# Patient Record
Sex: Female | Born: 1935 | Race: White | Hispanic: No | Marital: Married | Smoking: Never smoker
Health system: Southern US, Community
[De-identification: ages and names within clinical notes are randomized; demographics above are authoritative.]

## PROBLEM LIST (undated history)

## (undated) DIAGNOSIS — E785 Hyperlipidemia, unspecified: Secondary | ICD-10-CM

## (undated) DIAGNOSIS — I10 Essential (primary) hypertension: Secondary | ICD-10-CM

## (undated) HISTORY — PX: ABDOMINAL HYSTERECTOMY: SHX81

## (undated) HISTORY — PX: TUBAL LIGATION: SHX77

## (undated) HISTORY — PX: CHOLECYSTECTOMY: SHX55

---

## 1998-07-30 ENCOUNTER — Other Ambulatory Visit: Admission: RE | Admit: 1998-07-30 | Discharge: 1998-07-30 | Payer: Self-pay | Admitting: *Deleted

## 2000-09-14 ENCOUNTER — Other Ambulatory Visit: Admission: RE | Admit: 2000-09-14 | Discharge: 2000-09-14 | Payer: Self-pay | Admitting: *Deleted

## 2001-01-11 ENCOUNTER — Encounter: Admission: RE | Admit: 2001-01-11 | Discharge: 2001-01-11 | Payer: Self-pay | Admitting: Geriatric Medicine

## 2001-01-11 ENCOUNTER — Encounter: Payer: Self-pay | Admitting: Geriatric Medicine

## 2001-12-11 ENCOUNTER — Encounter: Payer: Self-pay | Admitting: Geriatric Medicine

## 2001-12-11 ENCOUNTER — Encounter: Admission: RE | Admit: 2001-12-11 | Discharge: 2001-12-11 | Payer: Self-pay | Admitting: Geriatric Medicine

## 2002-12-17 ENCOUNTER — Other Ambulatory Visit: Admission: RE | Admit: 2002-12-17 | Discharge: 2002-12-17 | Payer: Self-pay | Admitting: *Deleted

## 2003-03-26 ENCOUNTER — Encounter: Admission: RE | Admit: 2003-03-26 | Discharge: 2003-03-26 | Payer: Self-pay | Admitting: Geriatric Medicine

## 2003-03-26 ENCOUNTER — Encounter: Payer: Self-pay | Admitting: Geriatric Medicine

## 2004-10-25 ENCOUNTER — Ambulatory Visit (HOSPITAL_COMMUNITY): Admission: RE | Admit: 2004-10-25 | Discharge: 2004-10-25 | Payer: Self-pay | Admitting: Gastroenterology

## 2006-10-12 ENCOUNTER — Encounter: Admission: RE | Admit: 2006-10-12 | Discharge: 2006-10-12 | Payer: Self-pay | Admitting: Geriatric Medicine

## 2015-09-10 DIAGNOSIS — L821 Other seborrheic keratosis: Secondary | ICD-10-CM | POA: Diagnosis not present

## 2015-09-10 DIAGNOSIS — L570 Actinic keratosis: Secondary | ICD-10-CM | POA: Diagnosis not present

## 2015-09-10 DIAGNOSIS — D1801 Hemangioma of skin and subcutaneous tissue: Secondary | ICD-10-CM | POA: Diagnosis not present

## 2015-09-10 DIAGNOSIS — Z85828 Personal history of other malignant neoplasm of skin: Secondary | ICD-10-CM | POA: Diagnosis not present

## 2015-09-10 DIAGNOSIS — L812 Freckles: Secondary | ICD-10-CM | POA: Diagnosis not present

## 2015-09-10 DIAGNOSIS — D225 Melanocytic nevi of trunk: Secondary | ICD-10-CM | POA: Diagnosis not present

## 2015-10-29 DIAGNOSIS — Z789 Other specified health status: Secondary | ICD-10-CM | POA: Diagnosis not present

## 2015-10-29 DIAGNOSIS — Z0000 Encounter for general adult medical examination without abnormal findings: Secondary | ICD-10-CM | POA: Diagnosis not present

## 2015-10-29 DIAGNOSIS — Z1389 Encounter for screening for other disorder: Secondary | ICD-10-CM | POA: Diagnosis not present

## 2015-10-29 DIAGNOSIS — I10 Essential (primary) hypertension: Secondary | ICD-10-CM | POA: Diagnosis not present

## 2015-10-29 DIAGNOSIS — M8580 Other specified disorders of bone density and structure, unspecified site: Secondary | ICD-10-CM | POA: Diagnosis not present

## 2015-10-29 DIAGNOSIS — E7800 Pure hypercholesterolemia, unspecified: Secondary | ICD-10-CM | POA: Diagnosis not present

## 2015-10-29 DIAGNOSIS — Z79899 Other long term (current) drug therapy: Secondary | ICD-10-CM | POA: Diagnosis not present

## 2015-11-27 ENCOUNTER — Emergency Department (HOSPITAL_BASED_OUTPATIENT_CLINIC_OR_DEPARTMENT_OTHER)
Admission: EM | Admit: 2015-11-27 | Discharge: 2015-11-28 | Disposition: A | Discharge status: Home / Self Care | Payer: Medicare Other | Attending: Emergency Medicine | Admitting: Emergency Medicine

## 2015-11-27 ENCOUNTER — Encounter (HOSPITAL_BASED_OUTPATIENT_CLINIC_OR_DEPARTMENT_OTHER): Payer: Self-pay | Admitting: Emergency Medicine

## 2015-11-27 DIAGNOSIS — I10 Essential (primary) hypertension: Secondary | ICD-10-CM | POA: Diagnosis not present

## 2015-11-27 DIAGNOSIS — Y939 Activity, unspecified: Secondary | ICD-10-CM | POA: Insufficient documentation

## 2015-11-27 DIAGNOSIS — Z23 Encounter for immunization: Secondary | ICD-10-CM | POA: Insufficient documentation

## 2015-11-27 DIAGNOSIS — S0990XA Unspecified injury of head, initial encounter: Secondary | ICD-10-CM | POA: Diagnosis present

## 2015-11-27 DIAGNOSIS — Y929 Unspecified place or not applicable: Secondary | ICD-10-CM | POA: Insufficient documentation

## 2015-11-27 DIAGNOSIS — Y999 Unspecified external cause status: Secondary | ICD-10-CM | POA: Diagnosis not present

## 2015-11-27 DIAGNOSIS — W228XXA Striking against or struck by other objects, initial encounter: Secondary | ICD-10-CM | POA: Insufficient documentation

## 2015-11-27 DIAGNOSIS — E785 Hyperlipidemia, unspecified: Secondary | ICD-10-CM | POA: Diagnosis not present

## 2015-11-27 DIAGNOSIS — S0101XA Laceration without foreign body of scalp, initial encounter: Secondary | ICD-10-CM | POA: Insufficient documentation

## 2015-11-27 HISTORY — DX: Hyperlipidemia, unspecified: E78.5

## 2015-11-27 HISTORY — DX: Essential (primary) hypertension: I10

## 2015-11-27 MED ORDER — TETANUS-DIPHTH-ACELL PERTUSSIS 5-2.5-18.5 LF-MCG/0.5 IM SUSP
0.5000 mL | Freq: Once | INTRAMUSCULAR | Status: AC
  Administered 2015-11-27: 0.5 mL via INTRAMUSCULAR
  Filled 2015-11-27: qty 0.5

## 2015-11-27 NOTE — ED Notes (Signed)
Pt in c/o pain and small lac to top of head after striking head on the car trunk door. Bleeding controlled at this time, denies prescription anticoagulants but does take 81 mg aspirin daily.

## 2015-11-27 NOTE — ED Provider Notes (Signed)
CSN: [id number]     Arrival date & time 11/27/15  2228 History  By signing my name below, I, Bethel BornBritney McCollum, attest that this documentation has been prepared under the direction and in the presence of Paula LibraJohn Kamani Lewter, MD. Electronically Signed: Bethel BornBritney McCollum, ED Scribe. 11/27/2015. 11:34 PM   Chief Complaint  Patient presents with  . Head Injury    The history is provided by the patient. No language interpreter was used.   Kristina Branch is a 80 y.o. female who presents to the Emergency Department complaining of a laceration to the scalp with onset this evening after striking her head on the door to a car trunk. She did not fall or lose consciousness but notes that the wound was bleeding profusely PTA, controlled with pressure. Anus minimal. Pt denies neck pain and vomiting. Her last tetanus shot was 15 years ago and she notes that she had a local reaction after that injection.   Past Medical History  Diagnosis Date  . Hypertension   . Hyperlipidemia    Past Surgical History  Procedure Laterality Date  . Cholecystectomy    . Abdominal hysterectomy    . Tubal ligation     History reviewed. No pertinent family history. Social History  Substance Use Topics  . Smoking status: Never Smoker   . Smokeless tobacco: None  . Alcohol Use: No   OB History    No data available     Review of Systems  10 Systems reviewed and all are negative for acute change except as noted in the HPI.  Allergies  Review of patient's allergies indicates no known allergies.  Home Medications   Prior to Admission medications   Not on File   BP 160/73 mmHg  Pulse 80  Temp(Src) 97.9 F (36.6 C) (Oral)  Resp 18  Ht 5' (1.524 m)  Wt 139 lb (63.05 kg)  BMI 27.15 kg/m2  SpO2 100% Physical Exam General: Well-developed, well-nourished female in no acute distress; appearance consistent with age of record HENT: normocephalic; 1.4 cm laceration to the top of the head with minimal surrounding hematoma;  no hemotympanum Eyes: pupils equal, round and reactive to light; extraocular muscles intact Neck: supple Heart: regular rate and rhythm Lungs: clear to auscultation bilaterally Abdomen: soft; nondistended; nontender; bowel sounds present Extremities: No deformity; full range of motion; pulses normal Neurologic: Awake, alert and oriented; motor function intact in all extremities and symmetric; no facial droop Skin: Warm and dry Psychiatric: Normal mood and affect  ED Course  Procedures (including critical care time)  LACERATION REPAIR Performed by: Paula LibraJohn Ajahni Nay, MD Consent: Verbal consent obtained. Risks and benefits: risks, benefits and alternatives were discussed Patient identity confirmed: provided demographic data Time out performed prior to procedure Prepped and Draped in normal sterile fashion Wound explored Laceration Location: top of the head Laceration Length: 1.4 cm No Foreign Bodies seen or palpated Amount of cleaning: standard Skin closure: staples Number of sutures or staples: 3 Patient tolerance: Patient tolerated the procedure well with no immediate complications.  DIAGNOSTIC STUDIES: Oxygen Saturation is 100% on RA,  normal by my interpretation.     MDM   Final diagnoses:  Laceration of scalp, initial encounter   I personally performed the services described in this documentation, which was scribed in my presence. The recorded information has been reviewed and is accurate.    Paula LibraJohn Anhthu Perdew, MD 11/27/15 2340

## 2015-11-28 NOTE — ED Notes (Signed)
No reaction at immunization site noted.

## 2015-11-28 NOTE — ED Notes (Signed)
Pt given d/c instructions. Verbalizes understanding. No questions. 

## 2015-12-06 DIAGNOSIS — I10 Essential (primary) hypertension: Secondary | ICD-10-CM | POA: Diagnosis not present

## 2015-12-06 DIAGNOSIS — Z4802 Encounter for removal of sutures: Secondary | ICD-10-CM | POA: Diagnosis not present

## 2016-04-10 ENCOUNTER — Other Ambulatory Visit: Payer: Self-pay | Admitting: Geriatric Medicine

## 2016-04-10 ENCOUNTER — Ambulatory Visit
Admission: RE | Admit: 2016-04-10 | Discharge: 2016-04-10 | Disposition: A | Discharge status: Home / Self Care | Payer: Medicare Other | Source: Ambulatory Visit | Attending: Geriatric Medicine | Admitting: Geriatric Medicine

## 2016-04-10 DIAGNOSIS — Z23 Encounter for immunization: Secondary | ICD-10-CM | POA: Diagnosis not present

## 2016-04-10 DIAGNOSIS — M25552 Pain in left hip: Secondary | ICD-10-CM

## 2016-04-10 DIAGNOSIS — Z79899 Other long term (current) drug therapy: Secondary | ICD-10-CM | POA: Diagnosis not present

## 2016-04-10 DIAGNOSIS — I10 Essential (primary) hypertension: Secondary | ICD-10-CM | POA: Diagnosis not present

## 2016-05-17 DIAGNOSIS — Z79899 Other long term (current) drug therapy: Secondary | ICD-10-CM | POA: Diagnosis not present

## 2016-06-19 DIAGNOSIS — H5203 Hypermetropia, bilateral: Secondary | ICD-10-CM | POA: Diagnosis not present

## 2016-11-01 DIAGNOSIS — I10 Essential (primary) hypertension: Secondary | ICD-10-CM | POA: Diagnosis not present

## 2016-11-01 DIAGNOSIS — K219 Gastro-esophageal reflux disease without esophagitis: Secondary | ICD-10-CM | POA: Diagnosis not present

## 2016-11-01 DIAGNOSIS — M8580 Other specified disorders of bone density and structure, unspecified site: Secondary | ICD-10-CM | POA: Diagnosis not present

## 2016-11-01 DIAGNOSIS — Z0000 Encounter for general adult medical examination without abnormal findings: Secondary | ICD-10-CM | POA: Diagnosis not present

## 2016-11-16 DIAGNOSIS — Z780 Asymptomatic menopausal state: Secondary | ICD-10-CM | POA: Diagnosis not present

## 2016-11-16 DIAGNOSIS — M8589 Other specified disorders of bone density and structure, multiple sites: Secondary | ICD-10-CM | POA: Diagnosis not present

## 2016-11-16 DIAGNOSIS — Z1231 Encounter for screening mammogram for malignant neoplasm of breast: Secondary | ICD-10-CM | POA: Diagnosis not present

## 2017-02-08 ENCOUNTER — Emergency Department (HOSPITAL_COMMUNITY): Payer: Medicare Other

## 2017-02-08 ENCOUNTER — Encounter (HOSPITAL_COMMUNITY): Payer: Self-pay | Admitting: Emergency Medicine

## 2017-02-08 ENCOUNTER — Observation Stay (HOSPITAL_COMMUNITY)
Admission: EM | Admit: 2017-02-08 | Discharge: 2017-02-09 | Disposition: A | Discharge status: Home / Self Care | Payer: Medicare Other | Attending: Internal Medicine | Admitting: Internal Medicine

## 2017-02-08 DIAGNOSIS — R000 Tachycardia, unspecified: Secondary | ICD-10-CM | POA: Diagnosis not present

## 2017-02-08 DIAGNOSIS — Z8249 Family history of ischemic heart disease and other diseases of the circulatory system: Secondary | ICD-10-CM | POA: Insufficient documentation

## 2017-02-08 DIAGNOSIS — E7800 Pure hypercholesterolemia, unspecified: Secondary | ICD-10-CM

## 2017-02-08 DIAGNOSIS — R531 Weakness: Secondary | ICD-10-CM | POA: Insufficient documentation

## 2017-02-08 DIAGNOSIS — R079 Chest pain, unspecified: Secondary | ICD-10-CM | POA: Insufficient documentation

## 2017-02-08 DIAGNOSIS — Z7982 Long term (current) use of aspirin: Secondary | ICD-10-CM | POA: Insufficient documentation

## 2017-02-08 DIAGNOSIS — R6884 Jaw pain: Secondary | ICD-10-CM | POA: Insufficient documentation

## 2017-02-08 DIAGNOSIS — R202 Paresthesia of skin: Secondary | ICD-10-CM | POA: Diagnosis not present

## 2017-02-08 DIAGNOSIS — E785 Hyperlipidemia, unspecified: Secondary | ICD-10-CM | POA: Diagnosis present

## 2017-02-08 DIAGNOSIS — R5383 Other fatigue: Secondary | ICD-10-CM | POA: Insufficient documentation

## 2017-02-08 DIAGNOSIS — I10 Essential (primary) hypertension: Secondary | ICD-10-CM | POA: Insufficient documentation

## 2017-02-08 DIAGNOSIS — R42 Dizziness and giddiness: Secondary | ICD-10-CM | POA: Diagnosis not present

## 2017-02-08 DIAGNOSIS — Z79899 Other long term (current) drug therapy: Secondary | ICD-10-CM | POA: Diagnosis not present

## 2017-02-08 DIAGNOSIS — M79602 Pain in left arm: Secondary | ICD-10-CM | POA: Diagnosis not present

## 2017-02-08 DIAGNOSIS — I208 Other forms of angina pectoris: Secondary | ICD-10-CM

## 2017-02-08 LAB — CBC
HCT: 45.1 % (ref 36.0–46.0)
Hemoglobin: 14.5 g/dL (ref 12.0–15.0)
MCH: 30.1 pg (ref 26.0–34.0)
MCHC: 32.2 g/dL (ref 30.0–36.0)
MCV: 93.6 fL (ref 78.0–100.0)
PLATELETS: 259 10*3/uL (ref 150–400)
RBC: 4.82 MIL/uL (ref 3.87–5.11)
RDW: 13.1 % (ref 11.5–15.5)
WBC: 8.5 10*3/uL (ref 4.0–10.5)

## 2017-02-08 LAB — LIPID PANEL
Cholesterol: 228 mg/dL — ABNORMAL HIGH (ref 0–200)
HDL: 80 mg/dL (ref >40)
LDL CALC: 119 mg/dL — AB (ref 0–99)
Total CHOL/HDL Ratio: 2.9 RATIO
Triglycerides: 147 mg/dL (ref <150)
VLDL: 29 mg/dL (ref 0–40)

## 2017-02-08 LAB — TROPONIN I

## 2017-02-08 LAB — BASIC METABOLIC PANEL
Anion gap: 11 (ref 5–15)
BUN: 7 mg/dL (ref 6–20)
CALCIUM: 10 mg/dL (ref 8.9–10.3)
CO2: 24 mmol/L (ref 22–32)
CREATININE: 0.73 mg/dL (ref 0.44–1.00)
Chloride: 103 mmol/L (ref 101–111)
GFR calc Af Amer: >60 mL/min (ref >60)
GLUCOSE: 104 mg/dL — AB (ref 65–99)
Potassium: 3.9 mmol/L (ref 3.5–5.1)
Sodium: 138 mmol/L (ref 135–145)

## 2017-02-08 LAB — I-STAT TROPONIN, ED: TROPONIN I, POC: 0 ng/mL (ref 0.00–0.08)

## 2017-02-08 LAB — TSH: TSH: 1.321 u[IU]/mL (ref 0.350–4.500)

## 2017-02-08 MED ORDER — BIOTIN 300 MCG PO TABS: 300.0000 ug | ORAL_TABLET | Freq: Every day | ORAL | Status: DC

## 2017-02-08 MED ORDER — ZOLPIDEM TARTRATE 5 MG PO TABS
5.0000 mg | ORAL_TABLET | Freq: Once | ORAL | Status: AC
  Administered 2017-02-08: 5 mg via ORAL
  Filled 2017-02-08: qty 1

## 2017-02-08 MED ORDER — VITAMIN D 1000 UNITS PO TABS
1000.0000 [IU] | ORAL_TABLET | Freq: Every day | ORAL | Status: DC
  Administered 2017-02-09: 1000 [IU] via ORAL
  Filled 2017-02-08: qty 1

## 2017-02-08 MED ORDER — ADULT MULTIVITAMIN W/MINERALS CH
1.0000 | ORAL_TABLET | Freq: Every day | ORAL | Status: DC
  Administered 2017-02-09: 1 via ORAL
  Filled 2017-02-08: qty 1

## 2017-02-08 MED ORDER — ASPIRIN 325 MG PO TABS
325.0000 mg | ORAL_TABLET | Freq: Every day | ORAL | Status: DC
  Administered 2017-02-09: 325 mg via ORAL
  Filled 2017-02-08: qty 1

## 2017-02-08 MED ORDER — ONE-DAILY MULTI VITAMINS PO TABS: 1.0000 | ORAL_TABLET | Freq: Every day | ORAL | Status: DC

## 2017-02-08 MED ORDER — AMLODIPINE BESYLATE 5 MG PO TABS
5.0000 mg | ORAL_TABLET | Freq: Every day | ORAL | Status: DC
  Administered 2017-02-09: 5 mg via ORAL
  Filled 2017-02-08: qty 1

## 2017-02-08 MED ORDER — GI COCKTAIL ~~LOC~~: 30.0000 mL | Freq: Four times a day (QID) | ORAL | Status: DC | PRN

## 2017-02-08 MED ORDER — MORPHINE SULFATE (PF) 4 MG/ML IV SOLN: 2.0000 mg | INTRAVENOUS | Status: DC | PRN

## 2017-02-08 MED ORDER — ACETAMINOPHEN 325 MG PO TABS: 650.0000 mg | ORAL_TABLET | ORAL | Status: DC | PRN

## 2017-02-08 MED ORDER — ONDANSETRON HCL 4 MG/2ML IJ SOLN: 4.0000 mg | Freq: Four times a day (QID) | INTRAMUSCULAR | Status: DC | PRN

## 2017-02-08 MED ORDER — LOSARTAN POTASSIUM 25 MG PO TABS
25.0000 mg | ORAL_TABLET | Freq: Every day | ORAL | Status: DC
  Administered 2017-02-09: 25 mg via ORAL
  Filled 2017-02-08: qty 1

## 2017-02-08 MED ORDER — FUROSEMIDE 20 MG PO TABS
20.0000 mg | ORAL_TABLET | Freq: Every day | ORAL | Status: DC
  Administered 2017-02-09: 20 mg via ORAL
  Filled 2017-02-08: qty 1

## 2017-02-08 MED ORDER — ATORVASTATIN CALCIUM 20 MG PO TABS: 20.0000 mg | ORAL_TABLET | Freq: Every day | ORAL | Status: DC

## 2017-02-08 MED ORDER — ENOXAPARIN SODIUM 40 MG/0.4ML ~~LOC~~ SOLN
40.0000 mg | SUBCUTANEOUS | Status: DC
  Administered 2017-02-08: 40 mg via SUBCUTANEOUS
  Filled 2017-02-08: qty 0.4

## 2017-02-08 MED ORDER — HYDRALAZINE HCL 20 MG/ML IJ SOLN: 10.0000 mg | Freq: Three times a day (TID) | INTRAMUSCULAR | Status: DC | PRN

## 2017-02-08 MED ORDER — LORATADINE 10 MG PO TABS
10.0000 mg | ORAL_TABLET | Freq: Every day | ORAL | Status: DC
  Administered 2017-02-09: 10 mg via ORAL
  Filled 2017-02-08: qty 1

## 2017-02-08 MED ORDER — SODIUM CHLORIDE 0.9 % IV BOLUS (SEPSIS)
1000.0000 mL | Freq: Once | INTRAVENOUS | Status: AC
  Administered 2017-02-08: 1000 mL via INTRAVENOUS

## 2017-02-08 MED ORDER — ASPIRIN 81 MG PO CHEW
324.0000 mg | CHEWABLE_TABLET | Freq: Once | ORAL | Status: AC
  Administered 2017-02-08: 324 mg via ORAL
  Filled 2017-02-08: qty 4

## 2017-02-08 NOTE — H&P (Signed)
History and Physical    Kristina Branch ZOX:[id number] DOB: 04-14-1936 DOA: 02/08/2017  PCP: Patient, No Pcp Per Merlene Laughter Patient coming from: home  Chief Complaint: left arm tinlging/jaw  HPI: Kristina Branch is a very pleasant 81 y.o. female with medical history significant for htn and hyperlipidemia presents to the emergency department with a chief complaint of intermittent tingling of her left arm and left jaw pain. So evaluation concerning for ACS. Patient being admitted for rule out  Information is obtained from the patient. She states for the last 3 or 4 weeks she has felt "just not good". She states she's had a little less energy little more fatigued and just "hasn't felt well". For the last 3 or 4 days she has experienced intermittent left arm tingling. Associated symptoms include left jaw pain. She states the episodes are short-lived. She states she's had for 5 episodes in the last 2 days. Then she reports this morning she experienced right jaw pain that was "more severe than it's ever been". She denies any weakness in that arm. She is unable to articulate exactly where on the arm she experiences the tingling. She denies any numbness. She denies any recent injury or trauma to the neck. She denies headache visual disturbances syncope or near-syncope. She does describe some brief dizziness with position changes. She denies shortness of breath chest pain palpitations abdominal pain nausea vomiting diarrhea constipation melena. She denies dysuria hematuria frequency or urgency. She denies any fever chills recent travel or sick contacts. She does have chronic lower extremity edema and has had such for the last 30 years. He denies any changes in her lower extremity edema. She reports she did not take any antacids aspirin Tylenol.  ED Course: In the emergency department she's afebrile hemodynamically stable with a blood pressure the high end of normal slightly tachycardic she's not  hypoxic.  Review of Systems: As per HPI otherwise all other systems reviewed and are negative.   Ambulatory Status: Ambulates independently with a steady gait is independent with ADLs no recent falls  Past Medical History:  Diagnosis Date  . Hyperlipidemia   . Hypertension     Past Surgical History:  Procedure Laterality Date  . ABDOMINAL HYSTERECTOMY    . CHOLECYSTECTOMY    . TUBAL LIGATION      Social History   Social History  . Marital status: Married    Spouse name: N/A  . Number of children: N/A  . Years of education: N/A   Occupational History  . Not on file.   Social History Main Topics  . Smoking status: Never Smoker  . Smokeless tobacco: Not on file  . Alcohol use No  . Drug use: Unknown  . Sexual activity: Not on file   Other Topics Concern  . Not on file   Social History Narrative  . No narrative on file    No Known Allergies  Family History  Problem Relation Age of Onset  . CAD Mother   . Lung cancer Father   . Heart attack Brother        at age 87    Prior to Admission medications   Medication Sig Start Date End Date Taking? Authorizing Provider  amLODipine (NORVASC) 5 MG tablet Take 5 mg by mouth daily.   Yes [provider]  aspirin 81 MG chewable tablet Chew 81 mg by mouth daily.   Yes [provider]  atorvastatin (LIPITOR) 20 MG tablet Take 20 mg by mouth  daily.   Yes [provider]  Biotin 300 MCG TABS Take 300 mcg by mouth daily.   Yes [provider]  cetirizine (ZYRTEC) 10 MG tablet Take 10 mg by mouth daily.   Yes [provider]  cholecalciferol (VITAMIN D) 1000 units tablet Take 1,000 Units by mouth daily.   Yes [provider]  furosemide (LASIX) 20 MG tablet Take 20 mg by mouth daily.   Yes [provider]  Hypromellose (ARTIFICIAL TEARS OP) Place 1 drop into both eyes daily as needed (dry eyes).   Yes [provider]  losartan (COZAAR) 25 MG tablet Take  25 mg by mouth daily.   Yes [provider]  Multiple Vitamin (MULTIVITAMIN) tablet Take 1 tablet by mouth daily.   Yes [provider]    Physical Exam: Vitals:   02/08/17 1215 02/08/17 1230 02/08/17 1245 02/08/17 1315  BP: 134/64 (!) 169/86 (!) 158/68 (!) 150/72  Pulse: 90 100 99 (!) 102  Resp: 17 19 14 18   Temp:      SpO2: 97% 97% 96% 97%     General:  Appears Slightly anxious but not uncomfortable Eyes:  PERRL, EOMI, normal lids, iris ENT:  grossly normal hearing, lips & tongue, mucous membranes of her mouth are pink slightly dry Neck:  no LAD, masses or thyromegaly Cardiovascular:  Tachycardic but regular, no m/r/g. 1+ LE edema. Pedal pulses present and palpable Respiratory:  CTA bilaterally, no w/r/r. Normal respiratory effort. Abdomen:  soft, ntnd, positive bowel sounds throughout no guarding or rebounding Skin:  no rash or induration seen on limited exam Musculoskeletal:  grossly normal tone BUE/BLE, good ROM, no bony abnormality Psychiatric:  grossly normal mood and affect, speech fluent and appropriate, AOx3 Neurologic:  CN 2-12 grossly intact, moves all extremities in coordinated fashion, sensation intact. Speech clear facial symmetry. Bilateral grip 5 out of 5  Labs on Admission: I have personally reviewed following labs and imaging studies  CBC:  Recent Labs Lab 02/08/17 1018  WBC 8.5  HGB 14.5  HCT 45.1  MCV 93.6  PLT 259   Basic Metabolic Panel:  Recent Labs Lab 02/08/17 1018  NA 138  K 3.9  CL 103  CO2 24  GLUCOSE 104*  BUN 7  CREATININE 0.73  CALCIUM 10.0   GFR: CrCl cannot be calculated (Unknown ideal weight.). Liver Function Tests: No results for input(s): AST, ALT, ALKPHOS, BILITOT, PROT, ALBUMIN in the last 168 hours. No results for input(s): LIPASE, AMYLASE in the last 168 hours. No results for input(s): AMMONIA in the last 168 hours. Coagulation Profile: No results for input(s): INR, PROTIME in the last 168  hours. Cardiac Enzymes: No results for input(s): CKTOTAL, CKMB, CKMBINDEX, TROPONINI in the last 168 hours. BNP (last 3 results) No results for input(s): PROBNP in the last 8760 hours. HbA1C: No results for input(s): HGBA1C in the last 72 hours. CBG: No results for input(s): GLUCAP in the last 168 hours. Lipid Profile:  Recent Labs  02/08/17 1018  CHOL 228*  HDL 80  LDLCALC 119*  TRIG 147  CHOLHDL 2.9   Thyroid Function Tests: No results for input(s): TSH, T4TOTAL, FREET4, T3FREE, THYROIDAB in the last 72 hours. Anemia Panel: No results for input(s): VITAMINB12, FOLATE, FERRITIN, TIBC, IRON, RETICCTPCT in the last 72 hours. Urine analysis: No results found for: COLORURINE, APPEARANCEUR, LABSPEC, PHURINE, GLUCOSEU, HGBUR, BILIRUBINUR, KETONESUR, PROTEINUR, UROBILINOGEN, NITRITE, LEUKOCYTESUR  Creatinine Clearance: CrCl cannot be calculated (Unknown ideal weight.).  Sepsis Labs: @LABRCNTIP (procalcitonin:4,lacticidven:4) )No  results found for this or any previous visit (from the past 240 hour(s)).   Radiological Exams on Admission: Dg Chest 2 View  Result Date: 02/08/2017 CLINICAL DATA:  Right jaw pain.  Left arm tingling EXAM: CHEST  2 VIEW COMPARISON:  None. FINDINGS: Heart and mediastinal contours are within normal limits. No focal opacities or effusions. No acute bony abnormality. IMPRESSION: No active cardiopulmonary disease. Electronically Signed   By: Charlett NoseKevin  Dover M.D.   On: 02/08/2017 10:48    EKG: Independently reviewed. Sinus tachycardia Right atrial enlargement Left axis deviation Nonspecific ST abnormality Abnormal ECG  Assessment/Plan Principal Problem:   Left arm pain Active Problems:   Jaw pain   Hypertension   Hyperlipidemia   Generalized weakness   Tachycardia  #1. Left arm pain/tingling/left jaw pain. Atypical for ACS but she does have some risk factors. Heart score 5 (age, htn, hyperchol, family). Initial troponin negative EKG with some nonspecific  ST changes. Asked x-ray without cardiopulmonary process. Pain free on admission. -Admit to telemetry -Cycle troponin -Serial EKG -Aspirin -Continue statin -analgesia and anti-emetic -Blood pressure control -If possible patient would like to have stress test on outpatient basis so she can go to her preferred cardiology practice  2. Hypertension. Only fair control in the emergency department. Patient denies history of difficult to control blood pressure. Home medications include amlodipine, Lasix, losartan -We'll resume home meds -Monitor -Outpatient follow-up  #3. Generalized weakness. May be related to mild dehydration as she is not able to consistently drink water does take low dose lasix daily. Chest xray without cardiopulmonary process. Metabolic derangements. She is mildly tachycardic. -Obtain a TSH -Obtain urinalysis  #6. Tachycardia. Likely related to anxiety. EKG with sinus tach. See above. Initial troponin negative -Monitor on telemetry -See #1 -TSH    DVT prophylaxis: lovenox  Code Status: full  Family Communication: none present  Disposition Plan: home hopefully in am  Consults called: none Admission status: obs    Toya SmothersBLACK,KAREN M MD [facility]  If 7PM-7AM, please contact night-coverage [email or web address] Password [email or web address]  02/08/2017, 1:41 PM

## 2017-02-08 NOTE — ED Triage Notes (Signed)
Pt sts feeling generally weak since yesterday with some right sided jaw pain today; pt sts some left arm tingling at times but denies at present

## 2017-02-08 NOTE — ED Notes (Signed)
Patient ambulated to the restroom independently.  Denied any dizziness or gait difficulties.

## 2017-02-08 NOTE — ED Notes (Signed)
3E unable to take report at this time due to staffing. AC notified. Will attempt report again in 15 min. 

## 2017-02-08 NOTE — ED Provider Notes (Signed)
Emergency Department Provider Note   I have reviewed the triage vital signs and the nursing notes.   HISTORY  Chief Complaint Weakness and Jaw Pain   HPI Kristina Branch is a 81 y.o. female with PMH of HTN and HLD presents to the emergency department for evaluation of worsening, intermittent jaw pain and left arm tingling. Patient has had significant generalized weakness in addition to the above symptoms. Her weakness and occasional lightheadedness began approximately a week ago. She notes intermittent pain in the jaw that is occasionally on the left but mostly on the right. She describes it as beginning as sharp but then becomes a pressure sensation. This morning she had severe right-sided jaw pain/pressure which got her very concerned. She had some associated tingling in the left arm but no pain. No chest pain or difficulty breathing. No palpitations. No history of CAD. No fever or chills. No new medications. No obvious exertional component to jaw pain. No modifying factors. No radiation.   PCP: Dr. Pete GlatterStoneking   Past Medical History:  Diagnosis Date  . Hyperlipidemia   . Hypertension     Patient Active Problem List   Diagnosis Date Noted  . Jaw pain 02/08/2017  . Left arm pain 02/08/2017  . Hypertension 02/08/2017  . Chest pain 02/08/2017  . Generalized weakness 02/08/2017  . Tachycardia 02/08/2017  . Hyperlipidemia     Past Surgical History:  Procedure Laterality Date  . ABDOMINAL HYSTERECTOMY    . CHOLECYSTECTOMY    . TUBAL LIGATION        Allergies Patient has no known allergies.  Family History  Problem Relation Age of Onset  . CAD Mother   . Lung cancer Father   . Heart attack Brother        at age 79    Social History Social History  Substance Use Topics  . Smoking status: Never Smoker  . Smokeless tobacco: Not on file  . Alcohol use No    Review of Systems  Constitutional: No fever/chills. Positive generalized weakness.  Eyes: No visual  changes. ENT: No sore throat. Positive intermittent jaw pain.  Cardiovascular: Denies chest pain. Respiratory: Denies shortness of breath. Gastrointestinal: No abdominal pain.  No nausea, no vomiting.  No diarrhea.  No constipation. Genitourinary: Negative for dysuria. Musculoskeletal: Negative for back pain. Positive left arm tingling.  Skin: Negative for rash. Neurological: Negative for headaches, focal weakness or numbness.  10-point ROS otherwise negative.  ____________________________________________   PHYSICAL EXAM:  VITAL SIGNS: ED Triage Vitals  Enc Vitals Group     BP 02/08/17 1013 (!) 184/70     Pulse Rate 02/08/17 1013 (!) 117     Resp 02/08/17 1013 19     Temp 02/08/17 1013 (!) 97.4 F (36.3 C)     SpO2 02/08/17 1013 95 %     Pain Score 02/08/17 1017 3   Constitutional: Alert and oriented. Well appearing and in no acute distress. Eyes: Conjunctivae are normal.  Head: Atraumatic. Nose: No congestion/rhinnorhea. Mouth/Throat: Mucous membranes are moist.  Oropharynx non-erythematous. Neck: No stridor. Cardiovascular: Normal rate, regular rhythm. Good peripheral circulation. Grossly normal heart sounds.   Respiratory: Normal respiratory effort.  No retractions. Lungs CTAB. Gastrointestinal: Soft and nontender. No distention.  Musculoskeletal: No lower extremity tenderness nor edema. No gross deformities of extremities. Neurologic:  Normal speech and language. No gross focal neurologic deficits are appreciated.  Skin:  Skin is warm, dry and intact. No rash noted.  ____________________________________________   Vickie EpleyLABS (  all labs ordered are listed, but only abnormal results are displayed)  Labs Reviewed  BASIC METABOLIC PANEL - Abnormal; Notable for the following:       Result Value   Glucose, Bld 104 (*)    All other components within normal limits  LIPID PANEL - Abnormal; Notable for the following:    Cholesterol 228 (*)    LDL Cholesterol 119 (*)    All  other components within normal limits  CBC  TROPONIN I  TROPONIN I  TROPONIN I  TSH  I-STAT TROPOININ, ED   ____________________________________________  EKG   EKG Interpretation  Date/Time:  Thursday February 08 2017 10:17:22 EDT Ventricular Rate:  111 PR Interval:  144 QRS Duration: 76 QT Interval:  322 QTC Calculation: 437 R Axis:   -32 Text Interpretation:  Sinus tachycardia Right atrial enlargement Left axis deviation Nonspecific ST abnormality Abnormal ECG No STEMI.  Confirmed by Alona Bene [phone]) on 02/08/2017 12:28:21 PM       ____________________________________________  RADIOLOGY  Dg Chest 2 View  Result Date: 02/08/2017 CLINICAL DATA:  Right jaw pain.  Left arm tingling EXAM: CHEST  2 VIEW COMPARISON:  None. FINDINGS: Heart and mediastinal contours are within normal limits. No focal opacities or effusions. No acute bony abnormality. IMPRESSION: No active cardiopulmonary disease. Electronically Signed   By: Charlett Nose M.D.   On: 02/08/2017 10:48    ____________________________________________   PROCEDURES  Procedure(s) performed:   Procedures  None ____________________________________________   INITIAL IMPRESSION / ASSESSMENT AND PLAN / ED COURSE  Pertinent labs & imaging results that were available during my care of the patient were reviewed by me and considered in my medical decision making (see chart for details).  Patient presents to the emergency department for evaluation of intermittent jaw and left arm discomfort. No chest pain. Patient does have associated fatigue. No history of CAD or recent cardiac cath/stresstest. PCP is Dr. Pete Glatter. No current pain. Patient's EKG has some nonspecific ST changes that are possibly rate related given her mild tachycardia. Will give ASA and IVF. Plan for admission for ACS r/o.   Discussed patient's case with Hospitalist. Patient and family (if present) updated with plan. Care transferred to Hospitalist  service.  I reviewed all nursing notes, vitals, pertinent old records, EKGs, labs, imaging (as available).  ____________________________________________  FINAL CLINICAL IMPRESSION(S) / ED DIAGNOSES  Final diagnoses:  Atypical angina (HCC)  Jaw pain  Generalized weakness     MEDICATIONS GIVEN DURING THIS VISIT:  Medications  aspirin chewable tablet 324 mg (not administered)  amLODipine (NORVASC) tablet 5 mg (not administered)  atorvastatin (LIPITOR) tablet 20 mg (not administered)  loratadine (CLARITIN) tablet 10 mg (not administered)  cholecalciferol (VITAMIN D) tablet 1,000 Units (not administered)  furosemide (LASIX) tablet 20 mg (not administered)  losartan (COZAAR) tablet 25 mg (not administered)  multivitamin tablet 1 tablet (not administered)  acetaminophen (TYLENOL) tablet 650 mg (not administered)  ondansetron (ZOFRAN) injection 4 mg (not administered)  enoxaparin (LOVENOX) injection 40 mg (not administered)  morphine 4 MG/ML injection 2 mg (not administered)  gi cocktail (Maalox,Lidocaine,Donnatal) (not administered)  aspirin tablet 325 mg (not administered)  sodium chloride 0.9 % bolus 1,000 mL (1,000 mLs Intravenous New Bag/Given 02/08/17 1318)     NEW OUTPATIENT MEDICATIONS STARTED DURING THIS VISIT:  None   Note:  This document was prepared using Dragon voice recognition software and may include unintentional dictation errors.  Alona Bene, MD Emergency Medicine    Meriem Lemieux, Arlyss Repress, MD  02/08/17 1348  

## 2017-02-09 ENCOUNTER — Other Ambulatory Visit: Payer: Self-pay | Admitting: Cardiology

## 2017-02-09 DIAGNOSIS — I208 Other forms of angina pectoris: Secondary | ICD-10-CM | POA: Diagnosis not present

## 2017-02-09 DIAGNOSIS — R079 Chest pain, unspecified: Secondary | ICD-10-CM | POA: Diagnosis not present

## 2017-02-09 DIAGNOSIS — R6884 Jaw pain: Secondary | ICD-10-CM | POA: Diagnosis not present

## 2017-02-09 DIAGNOSIS — M79602 Pain in left arm: Secondary | ICD-10-CM | POA: Diagnosis not present

## 2017-02-09 DIAGNOSIS — E7800 Pure hypercholesterolemia, unspecified: Secondary | ICD-10-CM | POA: Diagnosis not present

## 2017-02-09 LAB — BASIC METABOLIC PANEL WITH GFR
Anion gap: 7 (ref 5–15)
BUN: 11 mg/dL (ref 6–20)
CO2: 25 mmol/L (ref 22–32)
Calcium: 9 mg/dL (ref 8.9–10.3)
Chloride: 108 mmol/L (ref 101–111)
Creatinine, Ser: 0.71 mg/dL (ref 0.44–1.00)
GFR calc Af Amer: >60 mL/min (ref >60)
GFR calc non Af Amer: >60 mL/min (ref >60)
Glucose, Bld: 75 mg/dL (ref 65–99)
Potassium: 3.5 mmol/L (ref 3.5–5.1)
Sodium: 140 mmol/L (ref 135–145)

## 2017-02-09 MED ORDER — NITROGLYCERIN 0.4 MG SL SUBL: 0.4000 mg | SUBLINGUAL_TABLET | SUBLINGUAL | 0 refills | Status: AC | PRN

## 2017-02-09 NOTE — Progress Notes (Signed)
Patient discharged for home via transport by family.  All discharge instructions reviewed with patient and she stated understanding.

## 2017-02-09 NOTE — Discharge Summary (Signed)
[facility]  Physician Discharge Summary   Patient ID: Kristina Branch MRN: [id number] DOB/AGE: 1936/06/22 81 y.o.  Admit date: 02/08/2017 Discharge date: 02/09/2017  PCP: Merlene Laughter, MD  DISCHARGE DIAGNOSES:  Principal Problem:   Left arm pain Active Problems:   Jaw pain   Hypertension   Hyperlipidemia   Generalized weakness   Tachycardia   RECOMMENDATIONS FOR OUTPATIENT FOLLOW UP: 1. [facility] to call patient to schedule outpatient stress test   DISCHARGE CONDITION: fair  Diet recommendation: Heart healthy  Filed Weights   02/08/17 2030 02/09/17 0458  Weight: 64.7 kg (142 lb 9.6 oz) 64.1 kg (141 lb 4.8 oz)    INITIAL HISTORY: 81 year old Caucasian female with a past medical history of hypertension, hypercholesterolemia, presented with jaw pain and left arm tingling. She did not have any chest pain. Concern was for atypical presentation of angina. Patient was hospitalized for further management  Consultations:  None  Procedures:  None  HOSPITAL COURSE:   Patient was hospitalized. She ruled out for acute coronary syndrome by serial negative troponins. EKG was repeated which did show any concerning ischemic changes. She is symptom free this morning. Unfortunately, she ate breakfast this morning, so could not have stress testing today. Patient was quite disappointed by this. We have tried to arrange for a stress test early next week. Cardiology office to get in touch with the patient. Patient wishes to go home today. Okay for discharge.  Her other medical issues including hypertension, hyperlipidemia remained stable. She may continue taking her home medications. LDL was 119. TSH 1.32.  Stable for discharge home.   PERTINENT LABS:  The results of significant diagnostics from this hospitalization (including imaging, microbiology, ancillary and laboratory) are listed below for reference.      Labs: Basic Metabolic Panel:  Recent Labs Lab  02/08/17 1018 02/09/17 0255  NA 138 140  K 3.9 3.5  CL 103 108  CO2 24 25  GLUCOSE 104* 75  BUN 7 11  CREATININE 0.73 0.71  CALCIUM 10.0 9.0   CBC:  Recent Labs Lab 02/08/17 1018  WBC 8.5  HGB 14.5  HCT 45.1  MCV 93.6  PLT 259   Cardiac Enzymes:  Recent Labs Lab 02/08/17 1323 02/08/17 1545 02/08/17 1930  TROPONINI <0.03 <0.03 <0.03     IMAGING STUDIES Dg Chest 2 View  Result Date: 02/08/2017 CLINICAL DATA:  Right jaw pain.  Left arm tingling EXAM: CHEST  2 VIEW COMPARISON:  None. FINDINGS: Heart and mediastinal contours are within normal limits. No focal opacities or effusions. No acute bony abnormality. IMPRESSION: No active cardiopulmonary disease. Electronically Signed   By: Charlett Nose M.D.   On: 02/08/2017 10:48    DISCHARGE EXAMINATION: Vitals:   02/08/17 2030 02/08/17 2346 02/09/17 0458 02/09/17 0938  BP: (!) 141/67 (!) 122/53 (!) 115/49 (!) 147/65  Pulse: 83 74 75 84  Resp: 16 18 18    Temp: 98 F (36.7 C) 97.7 F (36.5 C) 98 F (36.7 C)   TempSrc: Oral Oral Oral   SpO2: 97% 97% 99%   Weight: 64.7 kg (142 lb 9.6 oz)  64.1 kg (141 lb 4.8 oz)   Height: 5\' 1"  (1.549 m)      General appearance: alert, cooperative, appears stated age and no distress Resp: clear to auscultation bilaterally Cardio: regular rate and rhythm, S1, S2 normal, no murmur, click, rub or gallop GI: soft, non-tender; bowel sounds normal; no masses,  no organomegaly Extremities: extremities normal, atraumatic, no cyanosis  or edema  DISPOSITION: Home  Discharge Instructions    Call MD for:  difficulty breathing, headache or visual disturbances    Complete by:  As directed    Call MD for:  extreme fatigue    Complete by:  As directed    Call MD for:  persistant dizziness or light-headedness    Complete by:  As directed    Call MD for:  persistant nausea and vomiting    Complete by:  As directed    Call MD for:  severe uncontrolled pain    Complete by:  As directed    Call  MD for:  temperature >100.4    Complete by:  As directed    Diet - low sodium heart healthy    Complete by:  As directed    Discharge instructions    Complete by:  As directed    [facility] to schedule the stress test. You should still see your primary care physician in 1-2 weeks. Seek attention immediately if you develope chest pain, shortness of breath, dizziness, lightheadedness, excessive fatigue or persistence of symptoms that you presented with.  You were cared for by a hospitalist during your hospital stay. If you have any questions about your discharge medications or the care you received while you were in the hospital after you are discharged, you can call the unit and asked to speak with the hospitalist on call if the hospitalist that took care of you is not available. Once you are discharged, your primary care physician will handle any further medical issues. Please note that NO REFILLS for any discharge medications will be authorized once you are discharged, as it is imperative that you return to your primary care physician (or establish a relationship with a primary care physician if you do not have one) for your aftercare needs so that they can reassess your need for medications and monitor your lab values. If you do not have a primary care physician, you can call [phone] for a physician referral.   Increase activity slowly    Complete by:  As directed       ALLERGIES: No Known Allergies   Discharge Medication List as of 02/09/2017 12:28 PM    START taking these medications   Details  nitroGLYCERIN (NITROSTAT) 0.4 MG SL tablet Place 1 tablet (0.4 mg total) under the tongue every 5 (five) minutes as needed for chest pain., Starting Fri 02/09/2017, Print      CONTINUE these medications which have NOT CHANGED   Details  amLODipine (NORVASC) 5 MG tablet Take 5 mg by mouth daily., Historical Med    aspirin 81 MG chewable tablet Chew 81 mg by mouth daily.,  Historical Med    atorvastatin (LIPITOR) 20 MG tablet Take 20 mg by mouth daily., Historical Med    Biotin 300 MCG TABS Take 300 mcg by mouth daily., Historical Med    cetirizine (ZYRTEC) 10 MG tablet Take 10 mg by mouth daily., Historical Med    cholecalciferol (VITAMIN D) 1000 units tablet Take 1,000 Units by mouth daily., Historical Med    furosemide (LASIX) 20 MG tablet Take 20 mg by mouth daily., Historical Med    Hypromellose (ARTIFICIAL TEARS OP) Place 1 drop into both eyes daily as needed (dry eyes)., Historical Med    losartan (COZAAR) 25 MG tablet Take 25 mg by mouth daily., Historical Med    Multiple Vitamin (MULTIVITAMIN) tablet Take 1 tablet by mouth daily., Historical  Med         Follow-up Information    Merlene Laughter, MD. Schedule an appointment as soon as possible for a visit on 02/16/2017.   Specialty:  Internal Medicine Contact information: [address] [place] [address] [phone]           TOTAL DISCHARGE TIME: 35 minutes  [facility]  [facility] Pager [phone]  02/09/2017, 3:29 PM

## 2017-02-13 DIAGNOSIS — I10 Essential (primary) hypertension: Secondary | ICD-10-CM | POA: Diagnosis not present

## 2017-02-13 DIAGNOSIS — R6884 Jaw pain: Secondary | ICD-10-CM | POA: Diagnosis not present

## 2017-02-19 ENCOUNTER — Telehealth (HOSPITAL_COMMUNITY): Payer: Self-pay | Admitting: *Deleted

## 2017-02-19 NOTE — Telephone Encounter (Signed)
Patient given detailed instructions per Myocardial Perfusion Study Information Sheet for the test on 02/21/17. Patient notified to arrive 15 minutes early and that it is imperative to arrive on time for appointment to keep from having the test rescheduled.  If you need to cancel or reschedule your appointment, please call the office within 24 hours of your appointment. . Patient verbalized understanding. Kristina Branch    

## 2017-02-21 ENCOUNTER — Ambulatory Visit (HOSPITAL_COMMUNITY): Payer: Medicare Other | Attending: Internal Medicine

## 2017-02-21 DIAGNOSIS — R079 Chest pain, unspecified: Secondary | ICD-10-CM | POA: Diagnosis not present

## 2017-02-21 LAB — MYOCARDIAL PERFUSION IMAGING
CHL CUP NUCLEAR SDS: 4
CHL CUP NUCLEAR SRS: 1
CHL CUP RESTING HR STRESS: 77 {beats}/min
LHR: 0.23
LV dias vol: 54 mL (ref 46–106)
LV sys vol: 13 mL
NUC STRESS TID: 1.05
Peak HR: 106 {beats}/min
SSS: 5

## 2017-02-21 MED ORDER — TECHNETIUM TC 99M TETROFOSMIN IV KIT
10.6000 | PACK | Freq: Once | INTRAVENOUS | Status: AC | PRN
  Administered 2017-02-21: 10.6 via INTRAVENOUS
  Filled 2017-02-21: qty 11

## 2017-02-21 MED ORDER — TECHNETIUM TC 99M TETROFOSMIN IV KIT
32.2000 | PACK | Freq: Once | INTRAVENOUS | Status: AC | PRN
  Administered 2017-02-21: 32.2 via INTRAVENOUS
  Filled 2017-02-21: qty 33

## 2017-02-21 MED ORDER — REGADENOSON 0.4 MG/5ML IV SOLN
0.4000 mg | Freq: Once | INTRAVENOUS | Status: AC
  Administered 2017-02-21: 0.4 mg via INTRAVENOUS

## 2017-05-03 DIAGNOSIS — I10 Essential (primary) hypertension: Secondary | ICD-10-CM | POA: Diagnosis not present

## 2017-05-03 DIAGNOSIS — Z23 Encounter for immunization: Secondary | ICD-10-CM | POA: Diagnosis not present

## 2017-06-18 DIAGNOSIS — H5203 Hypermetropia, bilateral: Secondary | ICD-10-CM | POA: Diagnosis not present

## 2017-10-03 IMAGING — NM NM MISC PROCEDURE
3 series · 18 of 18 positions shown · non-contrast
Comparison: none

[Series 1: rest_(id)_sa · 6.4mm · 6.40mm/px · 6 of 64 frames shown]
[frame 6/64]
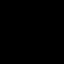
[frame 16/64]
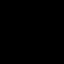
[frame 27/64]
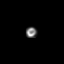
[frame 38/64]
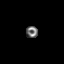
[frame 48/64]
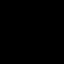
[frame 59/64]
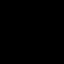

[Series 1: stress-sum-em_(id)_sa · 6.4mm · 6.40mm/px · 6 of 64 frames shown]
[frame 6/64]
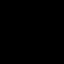
[frame 16/64]
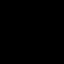
[frame 27/64]
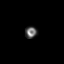
[frame 38/64]
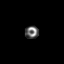
[frame 48/64]
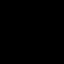
[frame 59/64]
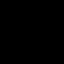

[Series 1: stress-gsp_(id)_sa · 6.4mm · 6.40mm/px · 6 of 512 frames shown]
[frame 43/512]
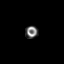
[frame 128/512]
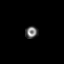
[frame 214/512]
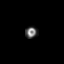
[frame 299/512]
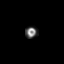
[frame 384/512]
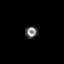
[frame 470/512]
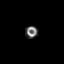

[18 of 18 positions shown; findings below may reference images not displayed]

Canned report from images found in remote index.

Refer to host system for actual result text.

## 2017-12-14 DIAGNOSIS — I10 Essential (primary) hypertension: Secondary | ICD-10-CM | POA: Diagnosis not present

## 2017-12-14 DIAGNOSIS — E7800 Pure hypercholesterolemia, unspecified: Secondary | ICD-10-CM | POA: Diagnosis not present

## 2017-12-14 DIAGNOSIS — Z0000 Encounter for general adult medical examination without abnormal findings: Secondary | ICD-10-CM | POA: Diagnosis not present

## 2017-12-14 DIAGNOSIS — K219 Gastro-esophageal reflux disease without esophagitis: Secondary | ICD-10-CM | POA: Diagnosis not present

## 2018-05-20 DIAGNOSIS — R69 Illness, unspecified: Secondary | ICD-10-CM | POA: Diagnosis not present

## 2018-06-18 DIAGNOSIS — J069 Acute upper respiratory infection, unspecified: Secondary | ICD-10-CM | POA: Diagnosis not present

## 2018-06-18 DIAGNOSIS — I10 Essential (primary) hypertension: Secondary | ICD-10-CM | POA: Diagnosis not present

## 2018-06-18 DIAGNOSIS — Z79899 Other long term (current) drug therapy: Secondary | ICD-10-CM | POA: Diagnosis not present

## 2018-06-18 DIAGNOSIS — R61 Generalized hyperhidrosis: Secondary | ICD-10-CM | POA: Diagnosis not present

## 2018-09-02 DIAGNOSIS — M8580 Other specified disorders of bone density and structure, unspecified site: Secondary | ICD-10-CM | POA: Diagnosis not present

## 2018-10-11 DIAGNOSIS — R5383 Other fatigue: Secondary | ICD-10-CM | POA: Diagnosis not present

## 2018-10-11 DIAGNOSIS — I10 Essential (primary) hypertension: Secondary | ICD-10-CM | POA: Diagnosis not present

## 2018-10-11 DIAGNOSIS — R61 Generalized hyperhidrosis: Secondary | ICD-10-CM | POA: Diagnosis not present

## 2018-10-11 DIAGNOSIS — Z79899 Other long term (current) drug therapy: Secondary | ICD-10-CM | POA: Diagnosis not present

## 2018-12-16 DIAGNOSIS — Z0000 Encounter for general adult medical examination without abnormal findings: Secondary | ICD-10-CM | POA: Diagnosis not present

## 2018-12-16 DIAGNOSIS — K219 Gastro-esophageal reflux disease without esophagitis: Secondary | ICD-10-CM | POA: Diagnosis not present

## 2018-12-16 DIAGNOSIS — E7800 Pure hypercholesterolemia, unspecified: Secondary | ICD-10-CM | POA: Diagnosis not present

## 2018-12-16 DIAGNOSIS — I10 Essential (primary) hypertension: Secondary | ICD-10-CM | POA: Diagnosis not present

## 2019-02-12 DIAGNOSIS — H5203 Hypermetropia, bilateral: Secondary | ICD-10-CM | POA: Diagnosis not present

## 2019-02-20 DIAGNOSIS — Z0120 Encounter for dental examination and cleaning without abnormal findings: Secondary | ICD-10-CM | POA: Diagnosis not present

## 2019-06-06 DIAGNOSIS — M545 Low back pain: Secondary | ICD-10-CM | POA: Diagnosis not present

## 2019-06-20 DIAGNOSIS — Z79899 Other long term (current) drug therapy: Secondary | ICD-10-CM | POA: Diagnosis not present

## 2019-06-20 DIAGNOSIS — E7800 Pure hypercholesterolemia, unspecified: Secondary | ICD-10-CM | POA: Diagnosis not present

## 2019-06-20 DIAGNOSIS — I10 Essential (primary) hypertension: Secondary | ICD-10-CM | POA: Diagnosis not present

## 2019-11-07 DIAGNOSIS — E7800 Pure hypercholesterolemia, unspecified: Secondary | ICD-10-CM | POA: Diagnosis not present

## 2019-11-07 DIAGNOSIS — I10 Essential (primary) hypertension: Secondary | ICD-10-CM | POA: Diagnosis not present

## 2019-11-07 DIAGNOSIS — M8580 Other specified disorders of bone density and structure, unspecified site: Secondary | ICD-10-CM | POA: Diagnosis not present

## 2019-12-19 DIAGNOSIS — Z0000 Encounter for general adult medical examination without abnormal findings: Secondary | ICD-10-CM | POA: Diagnosis not present

## 2019-12-19 DIAGNOSIS — Z1389 Encounter for screening for other disorder: Secondary | ICD-10-CM | POA: Diagnosis not present

## 2019-12-19 DIAGNOSIS — I10 Essential (primary) hypertension: Secondary | ICD-10-CM | POA: Diagnosis not present

## 2019-12-19 DIAGNOSIS — M8580 Other specified disorders of bone density and structure, unspecified site: Secondary | ICD-10-CM | POA: Diagnosis not present

## 2019-12-19 DIAGNOSIS — E7800 Pure hypercholesterolemia, unspecified: Secondary | ICD-10-CM | POA: Diagnosis not present

## 2019-12-19 DIAGNOSIS — K219 Gastro-esophageal reflux disease without esophagitis: Secondary | ICD-10-CM | POA: Diagnosis not present

## 2020-03-29 DIAGNOSIS — H5203 Hypermetropia, bilateral: Secondary | ICD-10-CM | POA: Diagnosis not present

## 2020-05-12 DIAGNOSIS — Z0120 Encounter for dental examination and cleaning without abnormal findings: Secondary | ICD-10-CM | POA: Diagnosis not present

## 2020-05-29 DIAGNOSIS — E7800 Pure hypercholesterolemia, unspecified: Secondary | ICD-10-CM | POA: Diagnosis not present

## 2020-05-29 DIAGNOSIS — I10 Essential (primary) hypertension: Secondary | ICD-10-CM | POA: Diagnosis not present

## 2020-05-29 DIAGNOSIS — M8580 Other specified disorders of bone density and structure, unspecified site: Secondary | ICD-10-CM | POA: Diagnosis not present

## 2020-06-10 DIAGNOSIS — K219 Gastro-esophageal reflux disease without esophagitis: Secondary | ICD-10-CM | POA: Diagnosis not present

## 2020-06-10 DIAGNOSIS — M8580 Other specified disorders of bone density and structure, unspecified site: Secondary | ICD-10-CM | POA: Diagnosis not present

## 2020-06-10 DIAGNOSIS — E7800 Pure hypercholesterolemia, unspecified: Secondary | ICD-10-CM | POA: Diagnosis not present

## 2020-06-10 DIAGNOSIS — I10 Essential (primary) hypertension: Secondary | ICD-10-CM | POA: Diagnosis not present

## 2020-06-22 DIAGNOSIS — Z79899 Other long term (current) drug therapy: Secondary | ICD-10-CM | POA: Diagnosis not present

## 2020-06-22 DIAGNOSIS — I10 Essential (primary) hypertension: Secondary | ICD-10-CM | POA: Diagnosis not present

## 2020-08-26 DIAGNOSIS — K219 Gastro-esophageal reflux disease without esophagitis: Secondary | ICD-10-CM | POA: Diagnosis not present

## 2020-08-26 DIAGNOSIS — I10 Essential (primary) hypertension: Secondary | ICD-10-CM | POA: Diagnosis not present

## 2020-08-26 DIAGNOSIS — E7800 Pure hypercholesterolemia, unspecified: Secondary | ICD-10-CM | POA: Diagnosis not present

## 2020-08-26 DIAGNOSIS — M8580 Other specified disorders of bone density and structure, unspecified site: Secondary | ICD-10-CM | POA: Diagnosis not present

## 2020-10-27 DIAGNOSIS — E7800 Pure hypercholesterolemia, unspecified: Secondary | ICD-10-CM | POA: Diagnosis not present

## 2020-10-27 DIAGNOSIS — M8580 Other specified disorders of bone density and structure, unspecified site: Secondary | ICD-10-CM | POA: Diagnosis not present

## 2020-10-27 DIAGNOSIS — I10 Essential (primary) hypertension: Secondary | ICD-10-CM | POA: Diagnosis not present

## 2020-10-27 DIAGNOSIS — K219 Gastro-esophageal reflux disease without esophagitis: Secondary | ICD-10-CM | POA: Diagnosis not present

## 2020-11-09 DIAGNOSIS — K219 Gastro-esophageal reflux disease without esophagitis: Secondary | ICD-10-CM | POA: Diagnosis not present

## 2020-11-09 DIAGNOSIS — E7800 Pure hypercholesterolemia, unspecified: Secondary | ICD-10-CM | POA: Diagnosis not present

## 2020-11-09 DIAGNOSIS — M8580 Other specified disorders of bone density and structure, unspecified site: Secondary | ICD-10-CM | POA: Diagnosis not present

## 2020-11-09 DIAGNOSIS — I10 Essential (primary) hypertension: Secondary | ICD-10-CM | POA: Diagnosis not present

## 2020-12-23 DIAGNOSIS — I10 Essential (primary) hypertension: Secondary | ICD-10-CM | POA: Diagnosis not present

## 2020-12-23 DIAGNOSIS — K219 Gastro-esophageal reflux disease without esophagitis: Secondary | ICD-10-CM | POA: Diagnosis not present

## 2020-12-23 DIAGNOSIS — G3184 Mild cognitive impairment, so stated: Secondary | ICD-10-CM | POA: Diagnosis not present

## 2020-12-23 DIAGNOSIS — Z79899 Other long term (current) drug therapy: Secondary | ICD-10-CM | POA: Diagnosis not present

## 2020-12-23 DIAGNOSIS — Z0000 Encounter for general adult medical examination without abnormal findings: Secondary | ICD-10-CM | POA: Diagnosis not present

## 2020-12-23 DIAGNOSIS — M8580 Other specified disorders of bone density and structure, unspecified site: Secondary | ICD-10-CM | POA: Diagnosis not present

## 2021-01-04 DIAGNOSIS — M8589 Other specified disorders of bone density and structure, multiple sites: Secondary | ICD-10-CM | POA: Diagnosis not present

## 2021-01-06 DIAGNOSIS — Z780 Asymptomatic menopausal state: Secondary | ICD-10-CM | POA: Diagnosis not present

## 2021-01-06 DIAGNOSIS — M8588 Other specified disorders of bone density and structure, other site: Secondary | ICD-10-CM | POA: Diagnosis not present

## 2021-02-04 DIAGNOSIS — I10 Essential (primary) hypertension: Secondary | ICD-10-CM | POA: Diagnosis not present

## 2021-02-04 DIAGNOSIS — K219 Gastro-esophageal reflux disease without esophagitis: Secondary | ICD-10-CM | POA: Diagnosis not present

## 2021-02-04 DIAGNOSIS — E7800 Pure hypercholesterolemia, unspecified: Secondary | ICD-10-CM | POA: Diagnosis not present

## 2021-02-04 DIAGNOSIS — M8580 Other specified disorders of bone density and structure, unspecified site: Secondary | ICD-10-CM | POA: Diagnosis not present

## 2021-03-30 DIAGNOSIS — H5203 Hypermetropia, bilateral: Secondary | ICD-10-CM | POA: Diagnosis not present

## 2021-04-29 DIAGNOSIS — D1723 Benign lipomatous neoplasm of skin and subcutaneous tissue of right leg: Secondary | ICD-10-CM | POA: Diagnosis not present

## 2021-04-29 DIAGNOSIS — I10 Essential (primary) hypertension: Secondary | ICD-10-CM | POA: Diagnosis not present

## 2021-04-29 DIAGNOSIS — R051 Acute cough: Secondary | ICD-10-CM | POA: Diagnosis not present

## 2021-04-29 DIAGNOSIS — R0981 Nasal congestion: Secondary | ICD-10-CM | POA: Diagnosis not present

## 2021-08-29 DIAGNOSIS — Z79899 Other long term (current) drug therapy: Secondary | ICD-10-CM | POA: Diagnosis not present

## 2021-08-29 DIAGNOSIS — I10 Essential (primary) hypertension: Secondary | ICD-10-CM | POA: Diagnosis not present

## 2022-01-02 DIAGNOSIS — M8580 Other specified disorders of bone density and structure, unspecified site: Secondary | ICD-10-CM | POA: Diagnosis not present

## 2022-01-02 DIAGNOSIS — G3184 Mild cognitive impairment, so stated: Secondary | ICD-10-CM | POA: Diagnosis not present

## 2022-01-02 DIAGNOSIS — Z79899 Other long term (current) drug therapy: Secondary | ICD-10-CM | POA: Diagnosis not present

## 2022-01-02 DIAGNOSIS — E7800 Pure hypercholesterolemia, unspecified: Secondary | ICD-10-CM | POA: Diagnosis not present

## 2022-01-02 DIAGNOSIS — Z0000 Encounter for general adult medical examination without abnormal findings: Secondary | ICD-10-CM | POA: Diagnosis not present

## 2022-01-02 DIAGNOSIS — Z1331 Encounter for screening for depression: Secondary | ICD-10-CM | POA: Diagnosis not present

## 2022-01-02 DIAGNOSIS — I10 Essential (primary) hypertension: Secondary | ICD-10-CM | POA: Diagnosis not present

## 2022-02-13 DIAGNOSIS — D481 Neoplasm of uncertain behavior of connective and other soft tissue: Secondary | ICD-10-CM | POA: Diagnosis not present

## 2022-03-10 DIAGNOSIS — D481 Neoplasm of uncertain behavior of connective and other soft tissue: Secondary | ICD-10-CM | POA: Diagnosis not present

## 2022-03-10 DIAGNOSIS — L720 Epidermal cyst: Secondary | ICD-10-CM | POA: Diagnosis not present

## 2022-03-15 DIAGNOSIS — G301 Alzheimer's disease with late onset: Secondary | ICD-10-CM | POA: Diagnosis not present

## 2022-03-15 DIAGNOSIS — F02A0 Dementia in other diseases classified elsewhere, mild, without behavioral disturbance, psychotic disturbance, mood disturbance, and anxiety: Secondary | ICD-10-CM | POA: Diagnosis not present

## 2022-05-05 DIAGNOSIS — H524 Presbyopia: Secondary | ICD-10-CM | POA: Diagnosis not present

## 2022-07-05 DIAGNOSIS — I10 Essential (primary) hypertension: Secondary | ICD-10-CM | POA: Diagnosis not present

## 2022-07-05 DIAGNOSIS — G301 Alzheimer's disease with late onset: Secondary | ICD-10-CM | POA: Diagnosis not present

## 2022-07-05 DIAGNOSIS — F02A0 Dementia in other diseases classified elsewhere, mild, without behavioral disturbance, psychotic disturbance, mood disturbance, and anxiety: Secondary | ICD-10-CM | POA: Diagnosis not present

## 2022-07-05 DIAGNOSIS — Z79899 Other long term (current) drug therapy: Secondary | ICD-10-CM | POA: Diagnosis not present

## 2022-08-03 DIAGNOSIS — H2512 Age-related nuclear cataract, left eye: Secondary | ICD-10-CM | POA: Diagnosis not present

## 2022-08-03 DIAGNOSIS — H269 Unspecified cataract: Secondary | ICD-10-CM | POA: Diagnosis not present

## 2022-08-11 DIAGNOSIS — D23122 Other benign neoplasm of skin of left lower eyelid, including canthus: Secondary | ICD-10-CM | POA: Diagnosis not present

## 2022-08-11 DIAGNOSIS — D23112 Other benign neoplasm of skin of right lower eyelid, including canthus: Secondary | ICD-10-CM | POA: Diagnosis not present

## 2022-09-01 DIAGNOSIS — Z961 Presence of intraocular lens: Secondary | ICD-10-CM | POA: Diagnosis not present

## 2022-10-02 DIAGNOSIS — G301 Alzheimer's disease with late onset: Secondary | ICD-10-CM | POA: Diagnosis not present

## 2022-10-02 DIAGNOSIS — R4181 Age-related cognitive decline: Secondary | ICD-10-CM | POA: Diagnosis not present

## 2022-10-02 DIAGNOSIS — F03A0 Unspecified dementia, mild, without behavioral disturbance, psychotic disturbance, mood disturbance, and anxiety: Secondary | ICD-10-CM | POA: Diagnosis not present

## 2022-10-02 DIAGNOSIS — I10 Essential (primary) hypertension: Secondary | ICD-10-CM | POA: Diagnosis not present

## 2022-10-04 DIAGNOSIS — K080 Exfoliation of teeth due to systemic causes: Secondary | ICD-10-CM | POA: Diagnosis not present

## 2022-12-24 DIAGNOSIS — H524 Presbyopia: Secondary | ICD-10-CM | POA: Diagnosis not present

## 2023-02-07 DIAGNOSIS — E7800 Pure hypercholesterolemia, unspecified: Secondary | ICD-10-CM | POA: Diagnosis not present

## 2023-02-07 DIAGNOSIS — I10 Essential (primary) hypertension: Secondary | ICD-10-CM | POA: Diagnosis not present

## 2023-02-07 DIAGNOSIS — Z0000 Encounter for general adult medical examination without abnormal findings: Secondary | ICD-10-CM | POA: Diagnosis not present

## 2023-02-07 DIAGNOSIS — Z1331 Encounter for screening for depression: Secondary | ICD-10-CM | POA: Diagnosis not present

## 2023-02-07 DIAGNOSIS — G301 Alzheimer's disease with late onset: Secondary | ICD-10-CM | POA: Diagnosis not present

## 2023-02-07 DIAGNOSIS — R4181 Age-related cognitive decline: Secondary | ICD-10-CM | POA: Diagnosis not present

## 2023-02-07 DIAGNOSIS — Z79899 Other long term (current) drug therapy: Secondary | ICD-10-CM | POA: Diagnosis not present

## 2023-02-07 DIAGNOSIS — M8589 Other specified disorders of bone density and structure, multiple sites: Secondary | ICD-10-CM | POA: Diagnosis not present

## 2023-08-08 DIAGNOSIS — R269 Unspecified abnormalities of gait and mobility: Secondary | ICD-10-CM | POA: Diagnosis not present

## 2023-08-08 DIAGNOSIS — I890 Lymphedema, not elsewhere classified: Secondary | ICD-10-CM | POA: Diagnosis not present

## 2023-08-08 DIAGNOSIS — I10 Essential (primary) hypertension: Secondary | ICD-10-CM | POA: Diagnosis not present

## 2023-08-08 DIAGNOSIS — G301 Alzheimer's disease with late onset: Secondary | ICD-10-CM | POA: Diagnosis not present

## 2023-10-17 DIAGNOSIS — H52203 Unspecified astigmatism, bilateral: Secondary | ICD-10-CM | POA: Diagnosis not present

## 2023-12-13 DIAGNOSIS — R21 Rash and other nonspecific skin eruption: Secondary | ICD-10-CM | POA: Diagnosis not present

## 2024-01-28 DIAGNOSIS — I10 Essential (primary) hypertension: Secondary | ICD-10-CM | POA: Diagnosis not present

## 2024-01-28 DIAGNOSIS — R4181 Age-related cognitive decline: Secondary | ICD-10-CM | POA: Diagnosis not present

## 2024-01-28 DIAGNOSIS — G301 Alzheimer's disease with late onset: Secondary | ICD-10-CM | POA: Diagnosis not present

## 2024-01-28 DIAGNOSIS — E7800 Pure hypercholesterolemia, unspecified: Secondary | ICD-10-CM | POA: Diagnosis not present

## 2024-02-28 DIAGNOSIS — I10 Essential (primary) hypertension: Secondary | ICD-10-CM | POA: Diagnosis not present

## 2024-02-28 DIAGNOSIS — E7800 Pure hypercholesterolemia, unspecified: Secondary | ICD-10-CM | POA: Diagnosis not present

## 2024-02-28 DIAGNOSIS — G301 Alzheimer's disease with late onset: Secondary | ICD-10-CM | POA: Diagnosis not present

## 2024-03-07 DIAGNOSIS — G301 Alzheimer's disease with late onset: Secondary | ICD-10-CM | POA: Diagnosis not present

## 2024-03-07 DIAGNOSIS — E7800 Pure hypercholesterolemia, unspecified: Secondary | ICD-10-CM | POA: Diagnosis not present

## 2024-03-07 DIAGNOSIS — Z23 Encounter for immunization: Secondary | ICD-10-CM | POA: Diagnosis not present

## 2024-03-07 DIAGNOSIS — F0280 Dementia in other diseases classified elsewhere without behavioral disturbance: Secondary | ICD-10-CM | POA: Diagnosis not present

## 2024-03-07 DIAGNOSIS — R634 Abnormal weight loss: Secondary | ICD-10-CM | POA: Diagnosis not present

## 2024-03-07 DIAGNOSIS — E441 Mild protein-calorie malnutrition: Secondary | ICD-10-CM | POA: Diagnosis not present

## 2024-03-07 DIAGNOSIS — Z1331 Encounter for screening for depression: Secondary | ICD-10-CM | POA: Diagnosis not present

## 2024-03-07 DIAGNOSIS — Z0000 Encounter for general adult medical examination without abnormal findings: Secondary | ICD-10-CM | POA: Diagnosis not present

## 2024-03-07 DIAGNOSIS — Z79899 Other long term (current) drug therapy: Secondary | ICD-10-CM | POA: Diagnosis not present

## 2024-03-30 DIAGNOSIS — G301 Alzheimer's disease with late onset: Secondary | ICD-10-CM | POA: Diagnosis not present

## 2024-03-30 DIAGNOSIS — R4181 Age-related cognitive decline: Secondary | ICD-10-CM | POA: Diagnosis not present

## 2024-03-30 DIAGNOSIS — I10 Essential (primary) hypertension: Secondary | ICD-10-CM | POA: Diagnosis not present

## 2024-03-30 DIAGNOSIS — E7800 Pure hypercholesterolemia, unspecified: Secondary | ICD-10-CM | POA: Diagnosis not present

## 2024-03-30 DIAGNOSIS — F0284 Dementia in other diseases classified elsewhere, unspecified severity, with anxiety: Secondary | ICD-10-CM | POA: Diagnosis not present

## 2024-04-29 DIAGNOSIS — G301 Alzheimer's disease with late onset: Secondary | ICD-10-CM | POA: Diagnosis not present

## 2024-04-29 DIAGNOSIS — E7800 Pure hypercholesterolemia, unspecified: Secondary | ICD-10-CM | POA: Diagnosis not present

## 2024-04-29 DIAGNOSIS — I10 Essential (primary) hypertension: Secondary | ICD-10-CM | POA: Diagnosis not present

## 2024-05-14 DIAGNOSIS — F419 Anxiety disorder, unspecified: Secondary | ICD-10-CM | POA: Diagnosis not present

## 2024-05-14 DIAGNOSIS — Z9181 History of falling: Secondary | ICD-10-CM | POA: Diagnosis not present

## 2024-05-14 DIAGNOSIS — E441 Mild protein-calorie malnutrition: Secondary | ICD-10-CM | POA: Diagnosis not present

## 2024-05-14 DIAGNOSIS — L309 Dermatitis, unspecified: Secondary | ICD-10-CM | POA: Diagnosis not present

## 2024-05-20 DIAGNOSIS — K760 Fatty (change of) liver, not elsewhere classified: Secondary | ICD-10-CM | POA: Diagnosis not present

## 2024-05-20 DIAGNOSIS — I10 Essential (primary) hypertension: Secondary | ICD-10-CM | POA: Diagnosis not present

## 2024-05-20 DIAGNOSIS — Z556 Problems related to health literacy: Secondary | ICD-10-CM | POA: Diagnosis not present

## 2024-05-20 DIAGNOSIS — K589 Irritable bowel syndrome without diarrhea: Secondary | ICD-10-CM | POA: Diagnosis not present

## 2024-05-20 DIAGNOSIS — K219 Gastro-esophageal reflux disease without esophagitis: Secondary | ICD-10-CM | POA: Diagnosis not present

## 2024-05-20 DIAGNOSIS — F0394 Unspecified dementia, unspecified severity, with anxiety: Secondary | ICD-10-CM | POA: Diagnosis not present

## 2024-05-20 DIAGNOSIS — E7800 Pure hypercholesterolemia, unspecified: Secondary | ICD-10-CM | POA: Diagnosis not present

## 2024-05-20 DIAGNOSIS — J309 Allergic rhinitis, unspecified: Secondary | ICD-10-CM | POA: Diagnosis not present

## 2024-05-20 DIAGNOSIS — E441 Mild protein-calorie malnutrition: Secondary | ICD-10-CM | POA: Diagnosis not present

## 2024-05-20 DIAGNOSIS — M8580 Other specified disorders of bone density and structure, unspecified site: Secondary | ICD-10-CM | POA: Diagnosis not present

## 2024-05-20 DIAGNOSIS — Z6821 Body mass index (BMI) 21.0-21.9, adult: Secondary | ICD-10-CM | POA: Diagnosis not present

## 2024-05-23 DIAGNOSIS — Z6821 Body mass index (BMI) 21.0-21.9, adult: Secondary | ICD-10-CM | POA: Diagnosis not present

## 2024-05-23 DIAGNOSIS — M8580 Other specified disorders of bone density and structure, unspecified site: Secondary | ICD-10-CM | POA: Diagnosis not present

## 2024-05-23 DIAGNOSIS — K760 Fatty (change of) liver, not elsewhere classified: Secondary | ICD-10-CM | POA: Diagnosis not present

## 2024-05-23 DIAGNOSIS — F0394 Unspecified dementia, unspecified severity, with anxiety: Secondary | ICD-10-CM | POA: Diagnosis not present

## 2024-05-23 DIAGNOSIS — Z556 Problems related to health literacy: Secondary | ICD-10-CM | POA: Diagnosis not present

## 2024-05-23 DIAGNOSIS — J309 Allergic rhinitis, unspecified: Secondary | ICD-10-CM | POA: Diagnosis not present

## 2024-05-23 DIAGNOSIS — E441 Mild protein-calorie malnutrition: Secondary | ICD-10-CM | POA: Diagnosis not present

## 2024-05-23 DIAGNOSIS — K219 Gastro-esophageal reflux disease without esophagitis: Secondary | ICD-10-CM | POA: Diagnosis not present

## 2024-05-23 DIAGNOSIS — E7800 Pure hypercholesterolemia, unspecified: Secondary | ICD-10-CM | POA: Diagnosis not present

## 2024-05-23 DIAGNOSIS — I10 Essential (primary) hypertension: Secondary | ICD-10-CM | POA: Diagnosis not present

## 2024-05-23 DIAGNOSIS — K589 Irritable bowel syndrome without diarrhea: Secondary | ICD-10-CM | POA: Diagnosis not present

## 2024-05-26 DIAGNOSIS — E7800 Pure hypercholesterolemia, unspecified: Secondary | ICD-10-CM | POA: Diagnosis not present

## 2024-05-26 DIAGNOSIS — K760 Fatty (change of) liver, not elsewhere classified: Secondary | ICD-10-CM | POA: Diagnosis not present

## 2024-05-26 DIAGNOSIS — I10 Essential (primary) hypertension: Secondary | ICD-10-CM | POA: Diagnosis not present

## 2024-05-26 DIAGNOSIS — Z6821 Body mass index (BMI) 21.0-21.9, adult: Secondary | ICD-10-CM | POA: Diagnosis not present

## 2024-05-26 DIAGNOSIS — K219 Gastro-esophageal reflux disease without esophagitis: Secondary | ICD-10-CM | POA: Diagnosis not present

## 2024-05-26 DIAGNOSIS — K589 Irritable bowel syndrome without diarrhea: Secondary | ICD-10-CM | POA: Diagnosis not present

## 2024-05-26 DIAGNOSIS — J309 Allergic rhinitis, unspecified: Secondary | ICD-10-CM | POA: Diagnosis not present

## 2024-05-26 DIAGNOSIS — Z556 Problems related to health literacy: Secondary | ICD-10-CM | POA: Diagnosis not present

## 2024-05-26 DIAGNOSIS — E441 Mild protein-calorie malnutrition: Secondary | ICD-10-CM | POA: Diagnosis not present

## 2024-05-26 DIAGNOSIS — F0394 Unspecified dementia, unspecified severity, with anxiety: Secondary | ICD-10-CM | POA: Diagnosis not present

## 2024-05-26 DIAGNOSIS — M8580 Other specified disorders of bone density and structure, unspecified site: Secondary | ICD-10-CM | POA: Diagnosis not present

## 2024-05-29 DIAGNOSIS — K589 Irritable bowel syndrome without diarrhea: Secondary | ICD-10-CM | POA: Diagnosis not present

## 2024-05-29 DIAGNOSIS — I10 Essential (primary) hypertension: Secondary | ICD-10-CM | POA: Diagnosis not present

## 2024-05-29 DIAGNOSIS — E7800 Pure hypercholesterolemia, unspecified: Secondary | ICD-10-CM | POA: Diagnosis not present

## 2024-05-29 DIAGNOSIS — F0394 Unspecified dementia, unspecified severity, with anxiety: Secondary | ICD-10-CM | POA: Diagnosis not present

## 2024-05-29 DIAGNOSIS — Z6821 Body mass index (BMI) 21.0-21.9, adult: Secondary | ICD-10-CM | POA: Diagnosis not present

## 2024-05-29 DIAGNOSIS — Z556 Problems related to health literacy: Secondary | ICD-10-CM | POA: Diagnosis not present

## 2024-05-29 DIAGNOSIS — E441 Mild protein-calorie malnutrition: Secondary | ICD-10-CM | POA: Diagnosis not present

## 2024-05-29 DIAGNOSIS — J309 Allergic rhinitis, unspecified: Secondary | ICD-10-CM | POA: Diagnosis not present

## 2024-05-29 DIAGNOSIS — K219 Gastro-esophageal reflux disease without esophagitis: Secondary | ICD-10-CM | POA: Diagnosis not present

## 2024-05-29 DIAGNOSIS — K760 Fatty (change of) liver, not elsewhere classified: Secondary | ICD-10-CM | POA: Diagnosis not present

## 2024-05-29 DIAGNOSIS — M8580 Other specified disorders of bone density and structure, unspecified site: Secondary | ICD-10-CM | POA: Diagnosis not present

## 2024-05-30 DIAGNOSIS — E7800 Pure hypercholesterolemia, unspecified: Secondary | ICD-10-CM | POA: Diagnosis not present

## 2024-05-30 DIAGNOSIS — R4181 Age-related cognitive decline: Secondary | ICD-10-CM | POA: Diagnosis not present

## 2024-05-30 DIAGNOSIS — I10 Essential (primary) hypertension: Secondary | ICD-10-CM | POA: Diagnosis not present

## 2024-05-30 DIAGNOSIS — G301 Alzheimer's disease with late onset: Secondary | ICD-10-CM | POA: Diagnosis not present

## 2024-06-02 DIAGNOSIS — E441 Mild protein-calorie malnutrition: Secondary | ICD-10-CM | POA: Diagnosis not present

## 2024-06-02 DIAGNOSIS — E7800 Pure hypercholesterolemia, unspecified: Secondary | ICD-10-CM | POA: Diagnosis not present

## 2024-06-02 DIAGNOSIS — M8580 Other specified disorders of bone density and structure, unspecified site: Secondary | ICD-10-CM | POA: Diagnosis not present

## 2024-06-02 DIAGNOSIS — K589 Irritable bowel syndrome without diarrhea: Secondary | ICD-10-CM | POA: Diagnosis not present

## 2024-06-02 DIAGNOSIS — F0394 Unspecified dementia, unspecified severity, with anxiety: Secondary | ICD-10-CM | POA: Diagnosis not present

## 2024-06-02 DIAGNOSIS — Z6821 Body mass index (BMI) 21.0-21.9, adult: Secondary | ICD-10-CM | POA: Diagnosis not present

## 2024-06-02 DIAGNOSIS — Z556 Problems related to health literacy: Secondary | ICD-10-CM | POA: Diagnosis not present

## 2024-06-02 DIAGNOSIS — J309 Allergic rhinitis, unspecified: Secondary | ICD-10-CM | POA: Diagnosis not present

## 2024-06-02 DIAGNOSIS — K760 Fatty (change of) liver, not elsewhere classified: Secondary | ICD-10-CM | POA: Diagnosis not present

## 2024-06-02 DIAGNOSIS — I10 Essential (primary) hypertension: Secondary | ICD-10-CM | POA: Diagnosis not present

## 2024-06-02 DIAGNOSIS — K219 Gastro-esophageal reflux disease without esophagitis: Secondary | ICD-10-CM | POA: Diagnosis not present

## 2024-06-05 DIAGNOSIS — K760 Fatty (change of) liver, not elsewhere classified: Secondary | ICD-10-CM | POA: Diagnosis not present

## 2024-06-05 DIAGNOSIS — F0394 Unspecified dementia, unspecified severity, with anxiety: Secondary | ICD-10-CM | POA: Diagnosis not present

## 2024-06-05 DIAGNOSIS — E7800 Pure hypercholesterolemia, unspecified: Secondary | ICD-10-CM | POA: Diagnosis not present

## 2024-06-05 DIAGNOSIS — J309 Allergic rhinitis, unspecified: Secondary | ICD-10-CM | POA: Diagnosis not present

## 2024-06-05 DIAGNOSIS — Z6821 Body mass index (BMI) 21.0-21.9, adult: Secondary | ICD-10-CM | POA: Diagnosis not present

## 2024-06-05 DIAGNOSIS — I10 Essential (primary) hypertension: Secondary | ICD-10-CM | POA: Diagnosis not present

## 2024-06-05 DIAGNOSIS — E441 Mild protein-calorie malnutrition: Secondary | ICD-10-CM | POA: Diagnosis not present

## 2024-06-05 DIAGNOSIS — K219 Gastro-esophageal reflux disease without esophagitis: Secondary | ICD-10-CM | POA: Diagnosis not present

## 2024-06-05 DIAGNOSIS — M8580 Other specified disorders of bone density and structure, unspecified site: Secondary | ICD-10-CM | POA: Diagnosis not present

## 2024-06-05 DIAGNOSIS — Z556 Problems related to health literacy: Secondary | ICD-10-CM | POA: Diagnosis not present

## 2024-06-05 DIAGNOSIS — K589 Irritable bowel syndrome without diarrhea: Secondary | ICD-10-CM | POA: Diagnosis not present

## 2024-06-09 DIAGNOSIS — K589 Irritable bowel syndrome without diarrhea: Secondary | ICD-10-CM | POA: Diagnosis not present

## 2024-06-09 DIAGNOSIS — E441 Mild protein-calorie malnutrition: Secondary | ICD-10-CM | POA: Diagnosis not present

## 2024-06-09 DIAGNOSIS — J309 Allergic rhinitis, unspecified: Secondary | ICD-10-CM | POA: Diagnosis not present

## 2024-06-09 DIAGNOSIS — M8580 Other specified disorders of bone density and structure, unspecified site: Secondary | ICD-10-CM | POA: Diagnosis not present

## 2024-06-09 DIAGNOSIS — E7800 Pure hypercholesterolemia, unspecified: Secondary | ICD-10-CM | POA: Diagnosis not present

## 2024-06-09 DIAGNOSIS — Z6821 Body mass index (BMI) 21.0-21.9, adult: Secondary | ICD-10-CM | POA: Diagnosis not present

## 2024-06-09 DIAGNOSIS — K219 Gastro-esophageal reflux disease without esophagitis: Secondary | ICD-10-CM | POA: Diagnosis not present

## 2024-06-09 DIAGNOSIS — K760 Fatty (change of) liver, not elsewhere classified: Secondary | ICD-10-CM | POA: Diagnosis not present

## 2024-06-09 DIAGNOSIS — I10 Essential (primary) hypertension: Secondary | ICD-10-CM | POA: Diagnosis not present

## 2024-06-09 DIAGNOSIS — Z556 Problems related to health literacy: Secondary | ICD-10-CM | POA: Diagnosis not present

## 2024-06-09 DIAGNOSIS — F0394 Unspecified dementia, unspecified severity, with anxiety: Secondary | ICD-10-CM | POA: Diagnosis not present

## 2024-06-16 DIAGNOSIS — Z6821 Body mass index (BMI) 21.0-21.9, adult: Secondary | ICD-10-CM | POA: Diagnosis not present

## 2024-06-16 DIAGNOSIS — J309 Allergic rhinitis, unspecified: Secondary | ICD-10-CM | POA: Diagnosis not present

## 2024-06-16 DIAGNOSIS — E7800 Pure hypercholesterolemia, unspecified: Secondary | ICD-10-CM | POA: Diagnosis not present

## 2024-06-16 DIAGNOSIS — K219 Gastro-esophageal reflux disease without esophagitis: Secondary | ICD-10-CM | POA: Diagnosis not present

## 2024-06-16 DIAGNOSIS — F0394 Unspecified dementia, unspecified severity, with anxiety: Secondary | ICD-10-CM | POA: Diagnosis not present

## 2024-06-16 DIAGNOSIS — K589 Irritable bowel syndrome without diarrhea: Secondary | ICD-10-CM | POA: Diagnosis not present

## 2024-06-16 DIAGNOSIS — I10 Essential (primary) hypertension: Secondary | ICD-10-CM | POA: Diagnosis not present

## 2024-06-16 DIAGNOSIS — Z556 Problems related to health literacy: Secondary | ICD-10-CM | POA: Diagnosis not present

## 2024-06-16 DIAGNOSIS — K760 Fatty (change of) liver, not elsewhere classified: Secondary | ICD-10-CM | POA: Diagnosis not present

## 2024-06-16 DIAGNOSIS — E441 Mild protein-calorie malnutrition: Secondary | ICD-10-CM | POA: Diagnosis not present

## 2024-06-16 DIAGNOSIS — M8580 Other specified disorders of bone density and structure, unspecified site: Secondary | ICD-10-CM | POA: Diagnosis not present

## 2024-06-19 DIAGNOSIS — F0394 Unspecified dementia, unspecified severity, with anxiety: Secondary | ICD-10-CM | POA: Diagnosis not present

## 2024-06-19 DIAGNOSIS — E441 Mild protein-calorie malnutrition: Secondary | ICD-10-CM | POA: Diagnosis not present

## 2024-06-19 DIAGNOSIS — J309 Allergic rhinitis, unspecified: Secondary | ICD-10-CM | POA: Diagnosis not present

## 2024-06-19 DIAGNOSIS — Z556 Problems related to health literacy: Secondary | ICD-10-CM | POA: Diagnosis not present

## 2024-06-19 DIAGNOSIS — E7800 Pure hypercholesterolemia, unspecified: Secondary | ICD-10-CM | POA: Diagnosis not present

## 2024-06-19 DIAGNOSIS — K760 Fatty (change of) liver, not elsewhere classified: Secondary | ICD-10-CM | POA: Diagnosis not present

## 2024-06-19 DIAGNOSIS — I10 Essential (primary) hypertension: Secondary | ICD-10-CM | POA: Diagnosis not present

## 2024-06-19 DIAGNOSIS — K219 Gastro-esophageal reflux disease without esophagitis: Secondary | ICD-10-CM | POA: Diagnosis not present

## 2024-06-19 DIAGNOSIS — Z6821 Body mass index (BMI) 21.0-21.9, adult: Secondary | ICD-10-CM | POA: Diagnosis not present

## 2024-06-19 DIAGNOSIS — K589 Irritable bowel syndrome without diarrhea: Secondary | ICD-10-CM | POA: Diagnosis not present

## 2024-06-19 DIAGNOSIS — M8580 Other specified disorders of bone density and structure, unspecified site: Secondary | ICD-10-CM | POA: Diagnosis not present

## 2024-06-23 DIAGNOSIS — I10 Essential (primary) hypertension: Secondary | ICD-10-CM | POA: Diagnosis not present

## 2024-06-23 DIAGNOSIS — Z556 Problems related to health literacy: Secondary | ICD-10-CM | POA: Diagnosis not present

## 2024-06-23 DIAGNOSIS — J309 Allergic rhinitis, unspecified: Secondary | ICD-10-CM | POA: Diagnosis not present

## 2024-06-23 DIAGNOSIS — E7800 Pure hypercholesterolemia, unspecified: Secondary | ICD-10-CM | POA: Diagnosis not present

## 2024-06-23 DIAGNOSIS — K760 Fatty (change of) liver, not elsewhere classified: Secondary | ICD-10-CM | POA: Diagnosis not present

## 2024-06-23 DIAGNOSIS — F0394 Unspecified dementia, unspecified severity, with anxiety: Secondary | ICD-10-CM | POA: Diagnosis not present

## 2024-06-23 DIAGNOSIS — Z6821 Body mass index (BMI) 21.0-21.9, adult: Secondary | ICD-10-CM | POA: Diagnosis not present

## 2024-06-23 DIAGNOSIS — M8580 Other specified disorders of bone density and structure, unspecified site: Secondary | ICD-10-CM | POA: Diagnosis not present

## 2024-06-23 DIAGNOSIS — E441 Mild protein-calorie malnutrition: Secondary | ICD-10-CM | POA: Diagnosis not present

## 2024-06-23 DIAGNOSIS — K219 Gastro-esophageal reflux disease without esophagitis: Secondary | ICD-10-CM | POA: Diagnosis not present

## 2024-06-23 DIAGNOSIS — K589 Irritable bowel syndrome without diarrhea: Secondary | ICD-10-CM | POA: Diagnosis not present

## 2024-06-29 DIAGNOSIS — I10 Essential (primary) hypertension: Secondary | ICD-10-CM | POA: Diagnosis not present

## 2024-06-29 DIAGNOSIS — G301 Alzheimer's disease with late onset: Secondary | ICD-10-CM | POA: Diagnosis not present

## 2024-06-29 DIAGNOSIS — R4181 Age-related cognitive decline: Secondary | ICD-10-CM | POA: Diagnosis not present

## 2024-06-29 DIAGNOSIS — E7800 Pure hypercholesterolemia, unspecified: Secondary | ICD-10-CM | POA: Diagnosis not present

## 2024-07-01 DIAGNOSIS — J309 Allergic rhinitis, unspecified: Secondary | ICD-10-CM | POA: Diagnosis not present

## 2024-07-01 DIAGNOSIS — E7800 Pure hypercholesterolemia, unspecified: Secondary | ICD-10-CM | POA: Diagnosis not present

## 2024-07-01 DIAGNOSIS — Z556 Problems related to health literacy: Secondary | ICD-10-CM | POA: Diagnosis not present

## 2024-07-01 DIAGNOSIS — K760 Fatty (change of) liver, not elsewhere classified: Secondary | ICD-10-CM | POA: Diagnosis not present

## 2024-07-01 DIAGNOSIS — Z6821 Body mass index (BMI) 21.0-21.9, adult: Secondary | ICD-10-CM | POA: Diagnosis not present

## 2024-07-01 DIAGNOSIS — K589 Irritable bowel syndrome without diarrhea: Secondary | ICD-10-CM | POA: Diagnosis not present

## 2024-07-01 DIAGNOSIS — E441 Mild protein-calorie malnutrition: Secondary | ICD-10-CM | POA: Diagnosis not present

## 2024-07-01 DIAGNOSIS — I10 Essential (primary) hypertension: Secondary | ICD-10-CM | POA: Diagnosis not present

## 2024-07-01 DIAGNOSIS — K219 Gastro-esophageal reflux disease without esophagitis: Secondary | ICD-10-CM | POA: Diagnosis not present

## 2024-07-01 DIAGNOSIS — F0394 Unspecified dementia, unspecified severity, with anxiety: Secondary | ICD-10-CM | POA: Diagnosis not present

## 2024-07-01 DIAGNOSIS — M8580 Other specified disorders of bone density and structure, unspecified site: Secondary | ICD-10-CM | POA: Diagnosis not present

## 2024-07-09 DIAGNOSIS — E441 Mild protein-calorie malnutrition: Secondary | ICD-10-CM | POA: Diagnosis not present

## 2024-07-09 DIAGNOSIS — K589 Irritable bowel syndrome without diarrhea: Secondary | ICD-10-CM | POA: Diagnosis not present

## 2024-07-09 DIAGNOSIS — K760 Fatty (change of) liver, not elsewhere classified: Secondary | ICD-10-CM | POA: Diagnosis not present

## 2024-07-09 DIAGNOSIS — K219 Gastro-esophageal reflux disease without esophagitis: Secondary | ICD-10-CM | POA: Diagnosis not present

## 2024-07-09 DIAGNOSIS — M8580 Other specified disorders of bone density and structure, unspecified site: Secondary | ICD-10-CM | POA: Diagnosis not present

## 2024-07-09 DIAGNOSIS — I10 Essential (primary) hypertension: Secondary | ICD-10-CM | POA: Diagnosis not present

## 2024-07-09 DIAGNOSIS — F0394 Unspecified dementia, unspecified severity, with anxiety: Secondary | ICD-10-CM | POA: Diagnosis not present

## 2024-07-09 DIAGNOSIS — Z6821 Body mass index (BMI) 21.0-21.9, adult: Secondary | ICD-10-CM | POA: Diagnosis not present

## 2024-07-09 DIAGNOSIS — Z556 Problems related to health literacy: Secondary | ICD-10-CM | POA: Diagnosis not present

## 2024-07-09 DIAGNOSIS — J309 Allergic rhinitis, unspecified: Secondary | ICD-10-CM | POA: Diagnosis not present

## 2024-07-09 DIAGNOSIS — E7800 Pure hypercholesterolemia, unspecified: Secondary | ICD-10-CM | POA: Diagnosis not present

## 2024-07-15 DIAGNOSIS — E7800 Pure hypercholesterolemia, unspecified: Secondary | ICD-10-CM | POA: Diagnosis not present

## 2024-07-15 DIAGNOSIS — I10 Essential (primary) hypertension: Secondary | ICD-10-CM | POA: Diagnosis not present

## 2024-07-15 DIAGNOSIS — Z556 Problems related to health literacy: Secondary | ICD-10-CM | POA: Diagnosis not present

## 2024-07-15 DIAGNOSIS — F0394 Unspecified dementia, unspecified severity, with anxiety: Secondary | ICD-10-CM | POA: Diagnosis not present

## 2024-07-15 DIAGNOSIS — K589 Irritable bowel syndrome without diarrhea: Secondary | ICD-10-CM | POA: Diagnosis not present

## 2024-07-15 DIAGNOSIS — K760 Fatty (change of) liver, not elsewhere classified: Secondary | ICD-10-CM | POA: Diagnosis not present

## 2024-07-15 DIAGNOSIS — M8580 Other specified disorders of bone density and structure, unspecified site: Secondary | ICD-10-CM | POA: Diagnosis not present

## 2024-07-15 DIAGNOSIS — J309 Allergic rhinitis, unspecified: Secondary | ICD-10-CM | POA: Diagnosis not present

## 2024-07-15 DIAGNOSIS — E441 Mild protein-calorie malnutrition: Secondary | ICD-10-CM | POA: Diagnosis not present

## 2024-07-15 DIAGNOSIS — Z6821 Body mass index (BMI) 21.0-21.9, adult: Secondary | ICD-10-CM | POA: Diagnosis not present

## 2024-07-15 DIAGNOSIS — K219 Gastro-esophageal reflux disease without esophagitis: Secondary | ICD-10-CM | POA: Diagnosis not present
